# Patient Record
Sex: Male | Born: 1975 | Race: White | Hispanic: No | Marital: Single | Smoking: Current every day smoker
Health system: Southern US, Community
[De-identification: ages and names within clinical notes are randomized; demographics above are authoritative.]

---

## 2003-12-31 ENCOUNTER — Emergency Department (HOSPITAL_COMMUNITY): Admission: EM | Admit: 2003-12-31 | Discharge: 2003-12-31 | Payer: Self-pay | Admitting: Emergency Medicine

## 2013-04-19 ENCOUNTER — Encounter (HOSPITAL_COMMUNITY): Payer: Self-pay | Admitting: Emergency Medicine

## 2013-04-19 ENCOUNTER — Emergency Department (HOSPITAL_COMMUNITY)
Admission: EM | Admit: 2013-04-19 | Discharge: 2013-04-20 | Disposition: A | Discharge status: Home / Self Care | Payer: No Typology Code available for payment source | Attending: Emergency Medicine | Admitting: Emergency Medicine

## 2013-04-19 DIAGNOSIS — S0181XA Laceration without foreign body of other part of head, initial encounter: Secondary | ICD-10-CM

## 2013-04-19 DIAGNOSIS — Z23 Encounter for immunization: Secondary | ICD-10-CM | POA: Insufficient documentation

## 2013-04-19 DIAGNOSIS — S0180XA Unspecified open wound of other part of head, initial encounter: Secondary | ICD-10-CM | POA: Insufficient documentation

## 2013-04-19 DIAGNOSIS — S51009A Unspecified open wound of unspecified elbow, initial encounter: Secondary | ICD-10-CM | POA: Insufficient documentation

## 2013-04-19 DIAGNOSIS — F10929 Alcohol use, unspecified with intoxication, unspecified: Secondary | ICD-10-CM

## 2013-04-19 DIAGNOSIS — F1010 Alcohol abuse, uncomplicated: Secondary | ICD-10-CM | POA: Insufficient documentation

## 2013-04-19 DIAGNOSIS — Y9389 Activity, other specified: Secondary | ICD-10-CM | POA: Insufficient documentation

## 2013-04-19 DIAGNOSIS — R7989 Other specified abnormal findings of blood chemistry: Secondary | ICD-10-CM

## 2013-04-19 DIAGNOSIS — S51012A Laceration without foreign body of left elbow, initial encounter: Secondary | ICD-10-CM

## 2013-04-19 DIAGNOSIS — R7402 Elevation of levels of lactic acid dehydrogenase (LDH): Secondary | ICD-10-CM | POA: Insufficient documentation

## 2013-04-19 DIAGNOSIS — R7401 Elevation of levels of liver transaminase levels: Secondary | ICD-10-CM | POA: Insufficient documentation

## 2013-04-19 DIAGNOSIS — Y92410 Unspecified street and highway as the place of occurrence of the external cause: Secondary | ICD-10-CM | POA: Insufficient documentation

## 2013-04-19 LAB — COMPREHENSIVE METABOLIC PANEL
ALT: 26 U/L (ref 0–53)
AST: 23 U/L (ref 0–37)
Albumin: 3.9 g/dL (ref 3.5–5.2)
Alkaline Phosphatase: 83 U/L (ref 39–117)
Chloride: 98 mEq/L (ref 96–112)
Potassium: 3.3 mEq/L — ABNORMAL LOW (ref 3.5–5.1)
Sodium: 135 mEq/L (ref 135–145)
Total Bilirubin: 0.2 mg/dL — ABNORMAL LOW (ref 0.3–1.2)
Total Protein: 6.7 g/dL (ref 6.0–8.3)

## 2013-04-19 LAB — CBC
HCT: 42.9 % (ref 39.0–52.0)
MCH: 32.3 pg (ref 26.0–34.0)
MCHC: 35.9 g/dL (ref 30.0–36.0)
MCV: 89.9 fL (ref 78.0–100.0)
RDW: 12.2 % (ref 11.5–15.5)
WBC: 18.5 10*3/uL — ABNORMAL HIGH (ref 4.0–10.5)

## 2013-04-19 LAB — SAMPLE TO BLOOD BANK

## 2013-04-19 LAB — CG4 I-STAT (LACTIC ACID): Lactic Acid, Venous: 3.33 mmol/L — ABNORMAL HIGH (ref 0.5–2.2)

## 2013-04-19 MED ORDER — TETANUS-DIPHTH-ACELL PERTUSSIS 5-2.5-18.5 LF-MCG/0.5 IM SUSP
0.5000 mL | Freq: Once | INTRAMUSCULAR | Status: AC
  Administered 2013-04-20: 0.5 mL via INTRAMUSCULAR
  Filled 2013-04-19: qty 0.5
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
## 2013-04-20 ENCOUNTER — Encounter (HOSPITAL_COMMUNITY): Payer: Self-pay | Admitting: Radiology

## 2013-04-20 ENCOUNTER — Emergency Department (HOSPITAL_COMMUNITY): Payer: No Typology Code available for payment source

## 2013-04-20 LAB — CG4 I-STAT (LACTIC ACID): Lactic Acid, Venous: 2.84 mmol/L — ABNORMAL HIGH (ref 0.5–2.2)

## 2013-04-20 MED ORDER — MORPHINE SULFATE 4 MG/ML IJ SOLN
4.0000 mg | Freq: Once | INTRAMUSCULAR | Status: AC
  Administered 2013-04-20: 4 mg via INTRAVENOUS
  Filled 2013-04-20: qty 1

## 2013-04-20 MED ORDER — OXYCODONE-ACETAMINOPHEN 5-325 MG PO TABS: 1.0000 | ORAL_TABLET | ORAL | Status: DC | PRN

## 2013-04-20 MED ORDER — SODIUM CHLORIDE 0.9 % IV BOLUS (SEPSIS)
1000.0000 mL | Freq: Once | INTRAVENOUS | Status: AC
  Administered 2013-04-20: 1000 mL via INTRAVENOUS

## 2013-04-20 MED ORDER — IOHEXOL 300 MG/ML  SOLN
100.0000 mL | Freq: Once | INTRAMUSCULAR | Status: AC | PRN
  Administered 2013-04-20: 100 mL via INTRAVENOUS

## 2013-04-20 NOTE — ED Provider Notes (Signed)
LACERATION REPAIR Performed by: Coral Ceo Authorized by: Coral Ceo Consent: Verbal consent obtained. Risks and benefits: risks, benefits and alternatives were discussed Consent given by: patient Patient identity confirmed: provided demographic data Prepped and Draped in normal sterile fashion Wound explored  Laceration Location: right eyebrow  Laceration Length: 4 cm  No Foreign Bodies seen or palpated  Anesthesia: local infiltration  Local anesthetic: lidocaine 2%    Anesthetic total: 8 ml  Irrigation method: syringe Amount of cleaning: extensive  Skin closure: 4-0 Vicryl (4 sutures) and 4-0 Ethilon (7 sutures)  Number of sutures: 11   Technique: simple interrupted   Patient tolerance: Patient tolerated the procedure well with no immediate complications.   LACERATION REPAIR Performed by: Coral Ceo Authorized by: Coral Ceo Consent: Verbal consent obtained. Risks and benefits: risks, benefits and alternatives were discussed Consent given by: patient Patient identity confirmed: provided demographic data Prepped and Draped in normal sterile fashion Wound explored  Laceration Location: right eyebrow  Laceration Length: 1 cm  No Foreign Bodies seen or palpated  Anesthesia: local infiltration  Local anesthetic: lidocaine 2%  Anesthetic total: 2 ml  Irrigation method: syringe Amount of cleaning: standard  Skin closure: 4-0 Ethilon   Number of sutures: 2  Technique: simple interrupted   Patient tolerance: Patient tolerated the procedure well with no immediate complications.   LACERATION REPAIR Performed by: Coral Ceo Authorized by: Coral Ceo Consent: Verbal consent obtained. Risks and benefits: risks, benefits and alternatives were discussed Consent given by: patient Patient identity confirmed: provided demographic data Prepped and Draped in normal sterile fashion Wound explored  Laceration Location: forehead    Laceration Length: 3 cm  No Foreign Bodies seen or palpated  Anesthesia: local infiltration  Local anesthetic: none  Irrigation method: syringe Amount of cleaning: standard  Skin closure: steri strips   Number of sutures: none  Technique: close   Patient tolerance: Patient tolerated the procedure well with no immediate complications.   LACERATION REPAIR Performed by: Coral Ceo Authorized by: Coral Ceo Consent: Verbal consent obtained. Risks and benefits: risks, benefits and alternatives were discussed Consent given by: patient Patient identity confirmed: provided demographic data Prepped and Draped in normal sterile fashion Wound explored  Laceration Location: left elbow   Laceration Length: 4 cm  No Foreign Bodies seen or palpated  Anesthesia: local infiltration  Local anesthetic: lidocaine 2%    Anesthetic total: 8 ml  Irrigation method: syringe Amount of cleaning: standard  Skin closure: 4-0 vicryl (3 sutures) and Ethilon (5 sutures)    Number of sutures: 7   Technique: simple interrupted   Patient tolerance: Patient tolerated the procedure well with no immediate complications.   LACERATION REPAIR Performed by: Coral Ceo Authorized by: Coral Ceo Consent: Verbal consent obtained. Risks and benefits: risks, benefits and alternatives were discussed Consent given by: patient Patient identity confirmed: provided demographic data Prepped and Draped in normal sterile fashion Wound explored  Laceration Location: left elbow  Laceration Length: 2 cm  No Foreign Bodies seen or palpated  Anesthesia: local infiltration  Local anesthetic: lidocaine 2%     Anesthetic total: 4 ml  Irrigation method: syringe Amount of cleaning: standard  Skin closure: 4-0 Ethilon   Number of sutures: 3  Technique: simple interrupted   Patient tolerance: Patient tolerated the procedure well with no immediate  complications.   LACERATION REPAIR Performed by: Coral Ceo Authorized by: Coral Ceo Consent: Verbal consent obtained. Risks and benefits: risks, benefits and alternatives were discussed Consent given by: patient  Patient identity confirmed: provided demographic data Prepped and Draped in normal sterile fashion Wound explored  Laceration Location: left hand   Laceration Length: 2 cm  No Foreign Bodies seen or palpated  Anesthesia: local infiltration  Local anesthetic: lidocaine 2%    Anesthetic total: 2 ml  Irrigation method: syringe Amount of cleaning: standard  Skin closure: 4-0 Ethilon   Number of sutures: 3  Technique: simple interrupted   Patient tolerance: Patient tolerated the procedure well with no immediate complications.   All wounds irrigated, antibiotic ointment applied, and dressings applied  Minor abrasions also cleaned, antibiotic ointment applied, and dressings applied.   Tetanus booster given in ED.     Performed at 8:00 AM  04/20/13   Greer Ee Kyrian Stage PA-C        Jillyn Ledger, PA-C 04/20/13 [phone]

## 2013-04-20 NOTE — ED Notes (Addendum)
PA-C at bedside 

## 2013-04-20 NOTE — ED Provider Notes (Signed)
Medical screening examination/treatment/procedure(s) were conducted as a shared visit with non-physician practitioner(s) and myself.  I personally evaluated the patient during the encounter   Dione Booze, MD 04/20/13 [phone]

## 2014-10-15 ENCOUNTER — Telehealth: Payer: Self-pay | Admitting: Family Medicine

## 2014-10-15 ENCOUNTER — Encounter: Payer: Self-pay | Admitting: Family Medicine

## 2014-10-15 ENCOUNTER — Ambulatory Visit (INDEPENDENT_AMBULATORY_CARE_PROVIDER_SITE_OTHER): Payer: Self-pay | Admitting: Family Medicine

## 2014-10-15 VITALS — BP 139/79 | HR 100 | Temp 96.4°F | Ht 71.0 in | Wt 229.6 lb

## 2014-10-15 DIAGNOSIS — F1910 Other psychoactive substance abuse, uncomplicated: Secondary | ICD-10-CM

## 2014-10-15 DIAGNOSIS — F329 Major depressive disorder, single episode, unspecified: Secondary | ICD-10-CM

## 2014-10-15 DIAGNOSIS — F32A Depression, unspecified: Secondary | ICD-10-CM

## 2014-10-15 MED ORDER — SERTRALINE HCL 50 MG PO TABS: 50.0000 mg | ORAL_TABLET | Freq: Every day | ORAL | Status: AC

## 2014-10-15 NOTE — Progress Notes (Signed)
   Subjective:    Patient ID: Gary Sheppard, male    DOB: 11/05/1975, 39 y.o.   MRN: [id number]  HPI Patient c/o depression, panic, and low mood.  He denies any suicide or homicidal ideation.  S+I+E+G+C+A+P+S+  He states the sx's have been going on for years. He is drinking every now and then.  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  He is depressed and withdrawn in the interview.  He wishes he never started to use drugs and wants help to get off drugs.   Review of Systems  Constitutional: Negative for fever.  HENT: Negative for ear pain.   Eyes: Negative for discharge.  Respiratory: Negative for cough.   Cardiovascular: Negative for chest pain.  Gastrointestinal: Negative for abdominal distention.  Endocrine: Negative for polyuria.  Genitourinary: Negative for difficulty urinating.  Musculoskeletal: Negative for gait problem and neck pain.  Skin: Negative for color change and rash.  Neurological: Negative for speech difficulty and headaches.  Psychiatric/Behavioral: Negative for agitation.       Objective:    BP 139/79 mmHg  Pulse 100  Temp(Src) 96.4 F (35.8 C) (Oral)  Ht 5\' 11"  (1.803 m)  Wt 229 lb 9.6 oz (104.146 kg)  BMI 32.04 kg/m2 Physical Exam  Constitutional: He is oriented to person, place, and time. He appears well-developed and well-nourished.  HENT:  Head: Normocephalic and atraumatic.  Mouth/Throat: Oropharynx is clear and moist.  Eyes: Pupils are equal, round, and reactive to light.  Neck: Normal range of motion. Neck supple.  Cardiovascular: Normal rate and regular rhythm.   No murmur heard. Pulmonary/Chest: Effort normal and breath sounds normal.  Abdominal: Soft. Bowel sounds are normal. There is no tenderness.  Neurological: He is alert and oriented to person, place, and time.  Skin: Skin is warm and dry.  Psychiatric:  Patient is depressed mood, flat, and withdrawn          Assessment & Plan:  Depression - Sertraline 50mg  one po qd  #30w/11  Substance abuse - Advised to stop using and seek help ASAP at psychiatry.  A list of psychiatric providers is given for patient to call and get an appointment.  Deatra CanterWilliam J Crews Mccollam FNP

## 2014-10-15 NOTE — Telephone Encounter (Signed)
Appointment desk scheduled appointment
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
## 2018-06-05 DEATH — deceased
